# Patient Record
Sex: Female | Born: 1995 | Race: White | Hispanic: No | Marital: Single | Smoking: Never smoker
Health system: Southern US, Community
[De-identification: ages and names within clinical notes are randomized; demographics above are authoritative.]

## PROBLEM LIST (undated history)

## (undated) HISTORY — PX: KNEE SURGERY: SHX244

---
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
## 2014-08-12 ENCOUNTER — Encounter: Payer: Self-pay | Admitting: Emergency Medicine

## 2014-08-12 ENCOUNTER — Emergency Department (INDEPENDENT_AMBULATORY_CARE_PROVIDER_SITE_OTHER)
Admission: EM | Admit: 2014-08-12 | Discharge: 2014-08-12 | Disposition: A | Discharge status: Home / Self Care | Payer: PRIVATE HEALTH INSURANCE | Source: Home / Self Care | Attending: Family Medicine | Admitting: Family Medicine

## 2014-08-12 DIAGNOSIS — N390 Urinary tract infection, site not specified: Secondary | ICD-10-CM

## 2014-08-12 LAB — POCT URINALYSIS DIP (MANUAL ENTRY)
Bilirubin, UA: NEGATIVE
GLUCOSE UA: NEGATIVE
NITRITE UA: POSITIVE
RBC UA: NEGATIVE
SPEC GRAV UA: 1.015 (ref 1.005–1.03)
Urobilinogen, UA: 0.2 (ref 0–1)
pH, UA: 7.5 (ref 5–8)

## 2014-08-12 MED ORDER — SULFAMETHOXAZOLE-TRIMETHOPRIM 800-160 MG PO TABS: 1.0000 | ORAL_TABLET | Freq: Two times a day (BID) | ORAL | Status: DC

## 2014-08-12 MED ORDER — HYDROCODONE-ACETAMINOPHEN 5-325 MG PO TABS: ORAL_TABLET | ORAL | Status: DC

## 2014-08-12 NOTE — Discharge Instructions (Signed)
Continue increased fluid intake.  May use non-prescription AZO for about two days, if desired, to decrease urinary discomfort.  Try Ibuprofen 200mg , 4 tabs every 8 hours with food for back pain.  If pain not improved by ibuprofen, may begin Lortab. If symptoms become significantly worse during the night or over the weekend, proceed to the local emergency room.    Urinary Tract Infection Urinary tract infections (UTIs) can develop anywhere along your urinary tract. Your urinary tract is your body's drainage system for removing wastes and extra water. Your urinary tract includes two kidneys, two ureters, a bladder, and a urethra. Your kidneys are a pair of bean-shaped organs. Each kidney is about the size of your fist. They are located below your ribs, one on each side of your spine. CAUSES Infections are caused by microbes, which are microscopic organisms, including fungi, viruses, and bacteria. These organisms are so small that they can only be seen through a microscope. Bacteria are the microbes that most commonly cause UTIs. SYMPTOMS  Symptoms of UTIs may vary by age and gender of the patient and by the location of the infection. Symptoms in young women typically include a frequent and intense urge to urinate and a painful, burning feeling in the bladder or urethra during urination. Older women and men are more likely to be tired, shaky, and weak and have muscle aches and abdominal pain. A fever may mean the infection is in your kidneys. Other symptoms of a kidney infection include pain in your back or sides below the ribs, nausea, and vomiting. DIAGNOSIS To diagnose a UTI, your caregiver will ask you about your symptoms. Your caregiver also will ask to provide a urine sample. The urine sample will be tested for bacteria and white blood cells. White blood cells are made by your body to help fight infection. TREATMENT  Typically, UTIs can be treated with medication. Because most UTIs are caused by a  bacterial infection, they usually can be treated with the use of antibiotics. The choice of antibiotic and length of treatment depend on your symptoms and the type of bacteria causing your infection. HOME CARE INSTRUCTIONS  If you were prescribed antibiotics, take them exactly as your caregiver instructs you. Finish the medication even if you feel better after you have only taken some of the medication.  Drink enough water and fluids to keep your urine clear or pale yellow.  Avoid caffeine, tea, and carbonated beverages. They tend to irritate your bladder.  Empty your bladder often. Avoid holding urine for long periods of time.  Empty your bladder before and after sexual intercourse.  After a bowel movement, women should cleanse from front to back. Use each tissue only once. SEEK MEDICAL CARE IF:   You have back pain.  You develop a fever.  Your symptoms do not begin to resolve within 3 days. SEEK IMMEDIATE MEDICAL CARE IF:   You have severe back pain or lower abdominal pain.  You develop chills.  You have nausea or vomiting.  You have continued burning or discomfort with urination. MAKE SURE YOU:   Understand these instructions.  Will watch your condition.  Will get help right away if you are not doing well or get worse. Document Released: 04/09/2005 Document Revised: 12/30/2011 Document Reviewed: 08/08/[address] [facility] Patient Information 2015 [employer], [place]. This information is not intended to replace advice given to you by your health care provider. Make sure you discuss any questions you have with your health care provider.

## 2014-08-12 NOTE — ED Notes (Signed)
Pt c/o urinary frequency and burning. C/o severe back pain x1 day.

## 2014-08-12 NOTE — ED Provider Notes (Signed)
CSN: [id number]     Arrival date & time 08/12/14  1400 History   First MD Initiated Contact with Patient 08/12/14 1417     Chief Complaint  Patient presents with  . Dysuria      HPI Comments: Patient noticed lower back pain extending to right flank yesterday, associated with onset of dysuria and frequency.  She has had nausea without vomiting.  No abdominal pain.  No fevers, chills, and sweats.  No recent antibiotic use.  Patient's last menstrual period was 07/14/2014 (approximate).   Patient is a 19 y.o. female presenting with dysuria. The history is provided by the patient and a parent.  Dysuria Pain quality:  Burning Pain severity:  Mild Onset quality:  Sudden Duration:  1 day Timing:  Constant Progression:  Unchanged Chronicity:  New Recent urinary tract infections: yes   Relieved by:  Nothing Worsened by:  Nothing tried Urinary symptoms: frequent urination and hesitancy   Urinary symptoms: no discolored urine, no foul-smelling urine, no hematuria and no bladder incontinence   Associated symptoms: flank pain and nausea   Associated symptoms: no abdominal pain, no fever, no vaginal discharge and no vomiting   Risk factors: recurrent urinary tract infections     History reviewed. No pertinent past medical history. Past Surgical History  Procedure Laterality Date  . Knee surgery     History reviewed. No pertinent family history. History  Substance Use Topics  . Smoking status: Never Smoker   . Smokeless tobacco: Not on file  . Alcohol Use: No   OB History    No data available     Review of Systems  Constitutional: Negative for fever.  Gastrointestinal: Positive for nausea. Negative for vomiting and abdominal pain.  Genitourinary: Positive for dysuria and flank pain. Negative for vaginal discharge.    Allergies  Review of patient's allergies indicates no known allergies.  Home Medications   Prior to Admission medications   Medication Sig Start Date End Date  Taking? Authorizing Provider  HYDROcodone-acetaminophen (NORCO/VICODIN) 5-325 MG per tablet Take one by mouth at bedtime as needed for pain 08/12/14   Lattie HawStephen A Beese, MD  ibuprofen (ADVIL,MOTRIN) 200 MG tablet Take 400 mg by mouth every 6 (six) hours as needed. Pain      Historical Provider, MD  sulfamethoxazole-trimethoprim (BACTRIM DS,SEPTRA DS) 800-160 MG per tablet Take 1 tablet by mouth 2 (two) times daily. 08/12/14   Lattie HawStephen A Beese, MD   BP 107/66 mmHg  Pulse 99  Temp(Src) 98.5 F (36.9 C) (Oral)  Wt 180 lb (81.647 kg)  SpO2 100%  LMP 07/14/2014 (Approximate) Physical Exam Nursing notes and Vital Signs reviewed. Appearance:  Patient appears healthy, stated age, and in no acute distress Eyes:  Pupils are equal, round, and reactive to light and accomodation.  Extraocular movement is intact.  Conjunctivae are not inflamed  Ears:  Canals normal.  Tympanic membranes normal.  Nose:  Normal Pharynx:  Normal; moist mucous membranes  Neck:  Supple.  No adenopathy Lungs:  Clear to auscultation.  Breath sounds are equal.  Heart:  Regular rate and rhythm without murmurs, rubs, or gallops.  Abdomen:  Mild tenderness over bladder without masses or hepatosplenomegaly.  Bowel sounds are present.  Mild right flank tenderness present without CVA tenderness. Extremities:  No edema.  No calf tenderness Skin:  No rash present.   ED Course  Procedures  None    Labs Reviewed  URINE CULTURE  POCT URINALYSIS DIP (MANUAL ENTRY):  KET 15mg /dL; PRO  Trace; NIT positive; LEU small       MDM   1. Urinary tract infection without hematuria, site unspecified    Urine culture pending.   Begin Bactrim DS BID for five days.  Lortab for pain at night. Continue increased fluid intake.  May use non-prescription AZO for about two days, if desired, to decrease urinary discomfort.  Try Ibuprofen , 4 tabs every 8 hours with food for back pain.  If pain not improved by ibuprofen, may begin Lortab. If  symptoms become significantly worse during the night or over the weekend, proceed to the local emergency room.  Followup with Family Doctor if not improved in 5 days.    Lattie Haw, MD 08/13/14 [phone]

## 2014-08-14 LAB — URINE CULTURE: Colony Count: >=100000

## 2017-01-15 ENCOUNTER — Other Ambulatory Visit: Payer: Self-pay | Admitting: Nurse Practitioner

## 2017-01-15 ENCOUNTER — Other Ambulatory Visit (HOSPITAL_COMMUNITY)
Admission: RE | Admit: 2017-01-15 | Discharge: 2017-01-15 | Disposition: A | Discharge status: Home / Self Care | Payer: PRIVATE HEALTH INSURANCE | Source: Ambulatory Visit | Attending: Nurse Practitioner | Admitting: Nurse Practitioner

## 2017-01-15 DIAGNOSIS — Z124 Encounter for screening for malignant neoplasm of cervix: Secondary | ICD-10-CM | POA: Diagnosis not present

## 2017-01-19 LAB — CYTOLOGY - PAP
Chlamydia: NEGATIVE
Diagnosis: NEGATIVE
Neisseria Gonorrhea: NEGATIVE

## 2017-04-01 ENCOUNTER — Emergency Department
Admission: EM | Admit: 2017-04-01 | Discharge: 2017-04-01 | Disposition: A | Discharge status: Home / Self Care | Payer: PRIVATE HEALTH INSURANCE | Source: Home / Self Care | Attending: Family Medicine | Admitting: Family Medicine

## 2017-04-01 ENCOUNTER — Encounter: Payer: Self-pay | Admitting: Emergency Medicine

## 2017-04-01 DIAGNOSIS — N3090 Cystitis, unspecified without hematuria: Secondary | ICD-10-CM | POA: Diagnosis not present

## 2017-04-01 LAB — POCT URINALYSIS DIP (MANUAL ENTRY)
BILIRUBIN UA: NEGATIVE
BILIRUBIN UA: NEGATIVE mg/dL
Glucose, UA: NEGATIVE mg/dL
Nitrite, UA: NEGATIVE
PH UA: 7.5 (ref 5.0–8.0)
Spec Grav, UA: 1.015 (ref 1.010–1.025)
Urobilinogen, UA: 0.2 E.U./dL — AB

## 2017-04-01 MED ORDER — ONDANSETRON 4 MG PO TBDP: ORAL_TABLET | ORAL | 0 refills | Status: DC

## 2017-04-01 MED ORDER — NITROFURANTOIN MONOHYD MACRO 100 MG PO CAPS: 100.0000 mg | ORAL_CAPSULE | Freq: Two times a day (BID) | ORAL | 0 refills | Status: DC

## 2017-04-01 NOTE — Discharge Instructions (Signed)
Increase fluid intake. May use non-prescription AZO for about two days, if desired, to decrease urinary discomfort.  If symptoms become significantly worse during the night or over the weekend, proceed to the local emergency room.  

## 2017-04-01 NOTE — ED Triage Notes (Signed)
Pt c/o back pain, dysuria, nausea and low grade temp since yesterday.

## 2017-04-01 NOTE — ED Provider Notes (Signed)
[facility]    CSN: [id number] Arrival date & time: 04/01/17  1407     History   Chief Complaint Chief Complaint  Patient presents with  . Dysuria    HPI Briana Murray is a 20 y.o. female.   Yesterday patient developed nausea (without vomiting), mild right flank pain, dysuria, and low grade temp.  No abdominal or pelvic pain.   The history is provided by the patient.  Dysuria  Pain quality:  Burning Pain severity:  Mild Onset quality:  Sudden Duration:  1 day Timing:  Constant Progression:  Worsening Chronicity:  New Recent urinary tract infections: no   Relieved by:  Nothing Worsened by:  Nothing Ineffective treatments:  Cranberry juice Urinary symptoms: frequent urination and hesitancy   Urinary symptoms: no discolored urine, no foul-smelling urine, no hematuria and no bladder incontinence   Associated symptoms: flank pain and nausea   Associated symptoms: no abdominal pain, no fever, no vaginal discharge and no vomiting     History reviewed. No pertinent past medical history.  There are no active problems to display for this patient.   Past Surgical History:  Procedure Laterality Date  . KNEE SURGERY      OB History    No data available       Home Medications    Prior to Admission medications   Medication Sig Start Date End Date Taking? Authorizing Provider  HYDROcodone-acetaminophen (NORCO/VICODIN) 5-325 MG per tablet Take one by mouth at bedtime as needed for pain 08/12/14   Lattie Haw, MD  ibuprofen (ADVIL,MOTRIN) 200 MG tablet Take 400 mg by mouth every 6 (six) hours as needed. Pain      [provider]  nitrofurantoin, macrocrystal-monohydrate, (MACROBID) 100 MG capsule Take 1 capsule (100 mg total) by mouth 2 (two) times daily. Take with food. 04/01/17   Lattie Haw, MD  sulfamethoxazole-trimethoprim (BACTRIM DS,SEPTRA DS) 800-160 MG per tablet Take 1 tablet by mouth 2 (two) times daily. 08/12/14   Lattie Haw, MD    Family History History reviewed. No pertinent family history.  Social History Social History  Substance Use Topics  . Smoking status: Never Smoker  . Smokeless tobacco: Never Used  . Alcohol use No     Allergies   Patient has no known allergies.   Review of Systems Review of Systems  Constitutional: Negative for fever.  Gastrointestinal: Positive for nausea. Negative for abdominal pain and vomiting.  Genitourinary: Positive for dysuria and flank pain. Negative for vaginal discharge.  All other systems reviewed and are negative.    Physical Exam Triage Vital Signs ED Triage Vitals  Enc Vitals Group     BP 04/01/17 1448 108/67     Pulse Rate 04/01/17 1448 79     Resp --      Temp 04/01/17 1448 98.4 F (36.9 C)     Temp Source 04/01/17 1448 Oral     SpO2 04/01/17 1448 99 %     Weight 04/01/17 1449 168 lb (76.2 kg)     Height --      Head Circumference --      Peak Flow --      Pain Score 04/01/17 1449 6     Pain Loc --      Pain Edu? --      Excl. in GC? --    No data found.   Updated Vital Signs BP 108/67 (BP Location: Right Arm)   Pulse 79  Temp 98.4 F (36.9 C) (Oral)   Wt 168 lb (76.2 kg)   SpO2 99%   Visual Acuity Right Eye Distance:   Left Eye Distance:   Bilateral Distance:    Right Eye Near:   Left Eye Near:    Bilateral Near:     Physical Exam Nursing notes and Vital Signs reviewed. Appearance:  Patient appears stated age, and in no acute distress.    Eyes:  Pupils are equal, round, and reactive to light and accomodation.  Extraocular movement is intact.  Conjunctivae are not inflamed   Pharynx:  Normal; moist mucous membranes  Neck:  Supple.  No adenopathy Lungs:  Clear to auscultation.  Breath sounds are equal.  Moving air well. Heart:  Regular rate and rhythm without murmurs, rubs, or gallops.  Abdomen:  Nontender without masses or hepatosplenomegaly.  Bowel sounds are present.  No CVA or flank tenderness.  Extremities:   No edema.  Skin:  No rash present.     UC Treatments / Results  Labs (all labs ordered are listed, but only abnormal results are displayed) Labs Reviewed  POCT URINALYSIS DIP (MANUAL ENTRY) - Abnormal; Notable for the following:       Result Value   Blood, UA small (*)    Protein Ur, POC trace (*)    Urobilinogen, UA negative (*)    Leukocytes, UA Small (1+) (*)    All other components within normal limits  URINE CULTURE    EKG  EKG Interpretation None       Radiology No results found.  Procedures Procedures (including critical care time)  Medications Ordered in UC Medications - No data to display   Initial Impression / Assessment and Plan / UC Course  I have reviewed the triage vital signs and the nursing notes.  Pertinent labs & imaging results that were available during my care of the patient were reviewed by me and considered in my medical decision making (see chart for details).    Urine culture pending. Begin Macrobid  BID for one week. Increase fluid intake. May use non-prescription AZO for about two days, if desired, to decrease urinary discomfort.  If symptoms become significantly worse during the night or over the weekend, proceed to the local emergency room.  Followup with Family Doctor if not improved in one week.     Final Clinical Impressions(s) / UC Diagnoses   Final diagnoses:  Cystitis    New Prescriptions New Prescriptions   NITROFURANTOIN, MACROCRYSTAL-MONOHYDRATE, (MACROBID) 100 MG CAPSULE    Take 1 capsule (100 mg total) by mouth 2 (two) times daily. Take with food.         Lattie Haw, MD 04/05/17 1034

## 2017-04-04 ENCOUNTER — Telehealth: Payer: Self-pay | Admitting: Emergency Medicine

## 2017-04-04 LAB — URINE CULTURE
MICRO NUMBER: 81036227
SPECIMEN QUALITY:: ADEQUATE

## 2017-04-05 ENCOUNTER — Telehealth: Payer: Self-pay | Admitting: Emergency Medicine

## 2017-04-05 MED ORDER — CEPHALEXIN 500 MG PO CAPS: 500.0000 mg | ORAL_CAPSULE | Freq: Two times a day (BID) | ORAL | 0 refills | Status: DC

## 2017-04-05 NOTE — Telephone Encounter (Signed)
Patient informed of positive urine culture.  Per Waylan Rocher, antbs given will not treat the infection.  Patient will be switched to Keflex  bid #14 no refills.  Please send antbs to [facility] [address].

## 2017-04-05 NOTE — Addendum Note (Signed)
Addended by: Lurene Shadow on: 04/05/2017 03:32 PM   Modules accepted: Orders

## 2017-04-05 NOTE — Telephone Encounter (Signed)
Antibiotic sent to pharmacy.  

## 2017-04-15 ENCOUNTER — Telehealth: Payer: Self-pay | Admitting: *Deleted

## 2017-04-15 MED ORDER — FLUCONAZOLE 150 MG PO TABS: 150.0000 mg | ORAL_TABLET | ORAL | 0 refills | Status: AC

## 2017-04-15 NOTE — Telephone Encounter (Signed)
Pt called reports that she now has vaginal itching and white d/c after taking Macrobid for UTI. Per standing order sent rx [facility].

## 2018-05-18 ENCOUNTER — Other Ambulatory Visit: Payer: Self-pay

## 2018-05-18 ENCOUNTER — Emergency Department (INDEPENDENT_AMBULATORY_CARE_PROVIDER_SITE_OTHER)
Admission: EM | Admit: 2018-05-18 | Discharge: 2018-05-18 | Disposition: A | Discharge status: Home / Self Care | Payer: PRIVATE HEALTH INSURANCE | Source: Home / Self Care | Attending: Family Medicine | Admitting: Family Medicine

## 2018-05-18 ENCOUNTER — Emergency Department (INDEPENDENT_AMBULATORY_CARE_PROVIDER_SITE_OTHER): Payer: PRIVATE HEALTH INSURANCE

## 2018-05-18 DIAGNOSIS — R05 Cough: Secondary | ICD-10-CM

## 2018-05-18 DIAGNOSIS — R509 Fever, unspecified: Secondary | ICD-10-CM

## 2018-05-18 DIAGNOSIS — R109 Unspecified abdominal pain: Secondary | ICD-10-CM

## 2018-05-18 DIAGNOSIS — J209 Acute bronchitis, unspecified: Secondary | ICD-10-CM | POA: Diagnosis not present

## 2018-05-18 DIAGNOSIS — R079 Chest pain, unspecified: Secondary | ICD-10-CM | POA: Diagnosis not present

## 2018-05-18 LAB — POCT URINALYSIS DIP (MANUAL ENTRY)
BILIRUBIN UA: NEGATIVE
BILIRUBIN UA: NEGATIVE mg/dL
Glucose, UA: NEGATIVE mg/dL
LEUKOCYTES UA: NEGATIVE
Nitrite, UA: NEGATIVE
Protein Ur, POC: NEGATIVE mg/dL
Spec Grav, UA: 1.025 (ref 1.010–1.025)
Urobilinogen, UA: 0.2 E.U./dL
pH, UA: 7 (ref 5.0–8.0)

## 2018-05-18 MED ORDER — BENZONATATE 200 MG PO CAPS: ORAL_CAPSULE | ORAL | 0 refills | Status: DC

## 2018-05-18 MED ORDER — LEVOFLOXACIN 500 MG PO TABS: ORAL_TABLET | ORAL | 0 refills | Status: DC

## 2018-05-18 NOTE — Discharge Instructions (Addendum)
Take plain guaifenesin (1200mg  extended release tabs such as Mucinex) twice daily, with plenty of water, for cough and congestion.  May add Pseudoephedrine (30mg , one or two every 4 to 6 hours) for sinus congestion.  Get adequate rest.   Try warm salt water gargles for sore throat.  Stop all antihistamines for now, and other non-prescription cough/cold preparations. May take Ibuprofen 200mg , 4 tabs every 8 hours with food for chest discomfort. May take Delsym Cough Suppressant with Tessalon at bedtime for nighttime cough.

## 2018-05-18 NOTE — ED Provider Notes (Signed)
[facility]    CSN: [id number] Arrival date & time: 05/18/18  1210     History   Chief Complaint Chief Complaint  Patient presents with  . Back Pain  . Nasal Congestion    HPI Briana Murray is a 22 y.o. female.   Patient is concerned that she could have a UTI.  She has a past history of kidney stone and pyelonephritis.  However, she has minimal urine symptoms.  She is presently having her period. She complains of onset of a cough about two weeks ago, with mild sore throat and nasal congestion.  She has had increased congestion for a week, developing fever/chills.  About 3 days ago she developed pain in her back with cough.  During the past 2 to 3 hours her back pain has become more intense in her right posterior chest.  The history is provided by the patient and a parent.    History reviewed. No pertinent past medical history.  There are no active problems to display for this patient.   Past Surgical History:  Procedure Laterality Date  . KNEE SURGERY      OB History   None      Home Medications    Prior to Admission medications   Medication Sig Start Date End Date Taking? Authorizing Provider  methylphenidate 36 MG PO CR tablet Take 36 mg by mouth daily.   Yes [provider]  benzonatate (TESSALON) 200 MG capsule Take one cap by mouth at bedtime as needed for cough.  May repeat in 4 to 6 hours 05/18/18   Lattie Haw, MD  etonogestrel (NEXPLANON) 68 MG IMPL implant by Subdermal route.    [provider]  ibuprofen (ADVIL,MOTRIN) 200 MG tablet Take 400 mg by mouth every 6 (six) hours as needed. Pain      [provider]  levofloxacin (LEVAQUIN) 500 MG tablet Take one tab by mouth once daily for one week 05/18/18   Lattie Haw, MD    Family History History reviewed. No pertinent family history.  Social History Social History   Tobacco Use  . Smoking status: Never Smoker  . Smokeless tobacco: Never Used    Substance Use Topics  . Alcohol use: No  . Drug use: Not on file     Allergies   Patient has no known allergies.   Review of Systems Review of Systems + sore throat + cough ? pleuritic pain No wheezing + nasal congestion + post-nasal drainage No sinus pain/pressure No itchy/red eyes No earache No hemoptysis No SOB + fever, + chills No nausea No vomiting No abdominal pain No diarrhea No urinary symptoms No skin rash + fatigue No myalgias No headache    Physical Exam Triage Vital Signs ED Triage Vitals  Enc Vitals Group     BP 05/18/18 1247 135/77     Pulse Rate 05/18/18 1247 97     Resp 05/18/18 1247 20     Temp 05/18/18 1247 98.9 F (37.2 C)     Temp Source 05/18/18 1247 Oral     SpO2 05/18/18 1247 100 %     Weight 05/18/18 1249 191 lb (86.6 kg)     Height 05/18/18 1249 5\' 9"  (1.753 m)     Head Circumference --      Peak Flow --      Pain Score 05/18/18 1248 6     Pain Loc --      Pain Edu? --  Excl. in GC? --    No data found.  Updated Vital Signs BP 135/77 (BP Location: Right Arm)   Pulse 97   Temp 98.9 F (37.2 C) (Oral)   Resp 20   Ht 5\' 9"  (1.753 m)   Wt 86.6 kg   LMP 05/18/2018   SpO2 100%   BMI 28.21 kg/m   Visual Acuity Right Eye Distance:   Left Eye Distance:   Bilateral Distance:    Right Eye Near:   Left Eye Near:    Bilateral Near:     Physical Exam Nursing notes and Vital Signs reviewed. Appearance:  Patient appears stated age, and in no acute distress Eyes:  Pupils are equal, round, and reactive to light and accomodation.  Extraocular movement is intact.  Conjunctivae are not inflamed  Ears:  Canals normal.  Tympanic membranes normal.  Nose:  Mildly congested turbinates.  No sinus tenderness.   Pharynx:  Normal Neck:  Supple.  Enlarged posterior/lateral nodes are palpated bilaterally, tender to palpation on the left.   Lungs:  Bibasilar rhonchi.  Breath sounds are equal.  Moving air well. Heart:  Regular rate  and rhythm without murmurs, rubs, or gallops.  Abdomen:  Nontender without masses or hepatosplenomegaly.  Bowel sounds are present.  No CVA or flank tenderness.  Extremities:  No edema.  Skin:  No rash present.    UC Treatments / Results  Labs (all labs ordered are listed, but only abnormal results are displayed) Labs Reviewed  POCT URINALYSIS DIP (MANUAL ENTRY) - Abnormal; Notable for the following components:      Result Value   Blood, UA trace-lysed (*)    All other components within normal limits  URINE CULTURE    EKG None  Radiology Dg Chest 2 View  Result Date: 05/18/2018 CLINICAL DATA:  Cough for 2 weeks, left posterior chest pain today, fever, malaise EXAM: CHEST - 2 VIEW COMPARISON:  None. FINDINGS: No active infiltrate or effusion is seen. Mediastinal and hilar contours are unremarkable. The heart is within normal limits in size. No acute bony abnormality is seen. IMPRESSION: No active cardiopulmonary disease. Electronically Signed   By: Dwyane Dee M.D.   On: 05/18/2018 13:14    Procedures Procedures (including critical care time)  Medications Ordered in UC Medications - No data to display  Initial Impression / Assessment and Plan / UC Course  I have reviewed the triage vital signs and the nursing notes.  Pertinent labs & imaging results that were available during my care of the patient were reviewed by me and considered in my medical decision making (see chart for details).    Suspect developing bacterial bronchitis from a persistent viral URI.  Urinalysis unremarkable but will send urine culture. Begin empiric Levaquin 500mg  daily for one week. Prescription written for Benzonatate [facility]) to take at bedtime for night-time cough.  Followup with Family Doctor if not improved in one week.    Final Clinical Impressions(s) / UC Diagnoses   Final diagnoses:  Left flank pain  Acute bronchitis, unspecified organism     Discharge Instructions     Take plain  guaifenesin (1200mg  extended release tabs such as Mucinex) twice daily, with plenty of water, for cough and congestion.  May add Pseudoephedrine (30mg , one or two every 4 to 6 hours) for sinus congestion.  Get adequate rest.   Try warm salt water gargles for sore throat.  Stop all antihistamines for now, and other non-prescription cough/cold preparations. May take Ibuprofen 200mg ,  4 tabs every 8 hours with food for chest discomfort. May take Delsym Cough Suppressant with Tessalon at bedtime for nighttime cough.     ED Prescriptions    Medication Sig Dispense Auth. Provider   levofloxacin (LEVAQUIN) 500 MG tablet Take one tab by mouth once daily for one week 7 tablet Lattie Haw, MD   benzonatate (TESSALON) 200 MG capsule Take one cap by mouth at bedtime as needed for cough.  May repeat in 4 to 6 hours 15 capsule Cathren Harsh Tera Mater, MD         Lattie Haw, MD 05/18/18 [phone]

## 2018-05-18 NOTE — ED Triage Notes (Signed)
Pt c/o back pain 6/10 starting 3 days ago and cold like symptoms 2-3 weeks. Back pain has intensified in the last 2-3 hrs. Of note pt is currently menstruating.

## 2018-05-19 LAB — URINE CULTURE
MICRO NUMBER:: 91330480
SPECIMEN QUALITY:: ADEQUATE

## 2018-05-20 ENCOUNTER — Telehealth: Payer: Self-pay

## 2018-05-20 NOTE — Telephone Encounter (Signed)
Left voice message inquiring about patients status. Encouraged patient to call with questions or concerns. Given neg ucx results as well.

## 2018-09-20 ENCOUNTER — Ambulatory Visit: Payer: PRIVATE HEALTH INSURANCE | Admitting: Sports Medicine

## 2018-09-20 ENCOUNTER — Ambulatory Visit (INDEPENDENT_AMBULATORY_CARE_PROVIDER_SITE_OTHER): Payer: PRIVATE HEALTH INSURANCE

## 2018-09-20 ENCOUNTER — Encounter: Payer: Self-pay | Admitting: Sports Medicine

## 2018-09-20 DIAGNOSIS — S62515A Nondisplaced fracture of proximal phalanx of left thumb, initial encounter for closed fracture: Secondary | ICD-10-CM | POA: Diagnosis not present

## 2018-09-20 DIAGNOSIS — S5332XA Traumatic rupture of left ulnar collateral ligament, initial encounter: Secondary | ICD-10-CM

## 2018-09-20 DIAGNOSIS — S63642A Sprain of metacarpophalangeal joint of left thumb, initial encounter: Secondary | ICD-10-CM | POA: Insufficient documentation

## 2018-09-20 DIAGNOSIS — W230XXA Caught, crushed, jammed, or pinched between moving objects, initial encounter: Secondary | ICD-10-CM | POA: Diagnosis not present

## 2018-09-20 NOTE — Progress Notes (Addendum)
Subjective:    CC: Left hand injury  HPI:  This is a pleasant 23 year old female, she went on a cruise a week ago, inadvertently injured her left thumb, she was running to the slide and it got caught and bent back.  She had pain, swelling, bruising.  Symptoms are improving considerably.  Localized at the ulnar aspect of the MCP.  No radiation.  I reviewed the past medical history, family history, social history, surgical history, and allergies today and no changes were needed.  Please see the problem list section below in epic for further details.  Past Medical History: No past medical history on file. Past Surgical History: Past Surgical History:  Procedure Laterality Date  . KNEE SURGERY     Social History: Social History   Socioeconomic History  . Marital status: Single    Spouse name: Not on file  . Number of children: Not on file  . Years of education: Not on file  . Highest education level: Not on file  Occupational History  . Not on file  Social Needs  . Financial resource strain: Not on file  . Food insecurity:    Worry: Not on file    Inability: Not on file  . Transportation needs:    Medical: Not on file    Non-medical: Not on file  Tobacco Use  . Smoking status: Never Smoker  . Smokeless tobacco: Never Used  Substance and Sexual Activity  . Alcohol use: No  . Drug use: Not on file  . Sexual activity: Yes    Birth control/protection: None  Lifestyle  . Physical activity:    Days per week: Not on file    Minutes per session: Not on file  . Stress: Not on file  Relationships  . Social connections:    Talks on phone: Not on file    Gets together: Not on file    Attends religious service: Not on file    Active member of club or organization: Not on file    Attends meetings of clubs or organizations: Not on file    Relationship status: Not on file  Other Topics Concern  . Not on file  Social History Narrative  . Not on file   Family History: No  family history on file. Allergies: No Known Allergies Medications: See med rec.  Review of Systems: No headache, visual changes, nausea, vomiting, diarrhea, constipation, dizziness, abdominal pain, skin rash, fevers, chills, night sweats, swollen lymph nodes, weight loss, chest pain, body aches, joint swelling, muscle aches, shortness of breath, mood changes, visual or auditory hallucinations.  Objective:    General: Well Developed, well nourished, and in no acute distress.  Neuro: Alert and oriented x3, extra-ocular muscles intact, sensation grossly intact.  HEENT: Normocephalic, atraumatic, pupils equal round reactive to light, neck supple, no masses, no lymphadenopathy, thyroid nonpalpable.  Skin: Warm and dry, no rashes noted.  Cardiac: Regular rate and rhythm, no murmurs rubs or gallops.  Respiratory: Clear to auscultation bilaterally. Not using accessory muscles, speaking in full sentences.  Abdominal: Soft, nontender, nondistended, positive bowel sounds, no masses, no organomegaly.  Left hand: Tender to palpation at the first MCP at the ulnar aspect.  Mild joint laxity.  Neurovascular intact distally.  Thumb spica brace applied.  Impression and Recommendations:    The patient was counselled, risk factors were discussed, anticipatory guidance given.  Left first proximal phalangeal fracture with gamekeeper's thumb. Injury about a week ago on a cruise ship. Thumb spica, may  use Tylenol and ibuprofen over-the-counter, x-rays. Return in 2 weeks.  X-rays do show a fracture of the proximal phalanx, nondisplaced, nonangulated, no change in treatment plan.   ___________________________________________ Ihor Austin. Benjamin Stain, M.D., ABFM., CAQSM. Primary Care and Sports Medicine [facility]  Adjunct Professor of Family Medicine  [employer]

## 2018-09-20 NOTE — Assessment & Plan Note (Addendum)
Injury about a week ago on a cruise ship. Thumb spica, may use Tylenol and ibuprofen over-the-counter, x-rays. Return in 2 weeks.  X-rays do show a fracture of the proximal phalanx, nondisplaced, nonangulated, no change in treatment plan.

## 2018-10-04 ENCOUNTER — Ambulatory Visit: Payer: PRIVATE HEALTH INSURANCE | Admitting: Sports Medicine

## 2018-10-04 ENCOUNTER — Ambulatory Visit (INDEPENDENT_AMBULATORY_CARE_PROVIDER_SITE_OTHER): Payer: PRIVATE HEALTH INSURANCE

## 2018-10-04 ENCOUNTER — Other Ambulatory Visit: Payer: Self-pay

## 2018-10-04 ENCOUNTER — Encounter: Payer: Self-pay | Admitting: Sports Medicine

## 2018-10-04 DIAGNOSIS — S5332XD Traumatic rupture of left ulnar collateral ligament, subsequent encounter: Secondary | ICD-10-CM | POA: Diagnosis not present

## 2018-10-04 DIAGNOSIS — X501XXD Overexertion from prolonged static or awkward postures, subsequent encounter: Secondary | ICD-10-CM

## 2018-10-04 DIAGNOSIS — S63642D Sprain of metacarpophalangeal joint of left thumb, subsequent encounter: Secondary | ICD-10-CM

## 2018-10-04 DIAGNOSIS — S62515D Nondisplaced fracture of proximal phalanx of left thumb, subsequent encounter for fracture with routine healing: Secondary | ICD-10-CM

## 2018-10-04 MED ORDER — TRAMADOL HCL 50 MG PO TABS: 50.0000 mg | ORAL_TABLET | Freq: Three times a day (TID) | ORAL | 0 refills | Status: DC | PRN

## 2018-10-04 NOTE — Assessment & Plan Note (Addendum)
2 weeks post injury, continue thumb spica. Repeat x-rays today, she did jam it over the past week. Adding some tramadol for pain, I also pulled her brace distally a bit to better immobilize the thumb. Return to see me in 1 month.

## 2018-10-04 NOTE — Progress Notes (Signed)
  Subjective:    CC: Recheck fracture  HPI: Briana Murray returns, she is a pleasant 23 year old female, she has a left first proximal phalangeal fracture with a gamekeeper's thumb, she sustained this on a cruise about 3 weeks ago.  She still has a bit of pain in her spica brace.  I reviewed the past medical history, family history, social history, surgical history, and allergies today and no changes were needed.  Please see the problem list section below in epic for further details.  Past Medical History: No past medical history on file. Past Surgical History: Past Surgical History:  Procedure Laterality Date  . KNEE SURGERY     Social History: Social History   Socioeconomic History  . Marital status: Single    Spouse name: Not on file  . Number of children: Not on file  . Years of education: Not on file  . Highest education level: Not on file  Occupational History  . Not on file  Social Needs  . Financial resource strain: Not on file  . Food insecurity:    Worry: Not on file    Inability: Not on file  . Transportation needs:    Medical: Not on file    Non-medical: Not on file  Tobacco Use  . Smoking status: Never Smoker  . Smokeless tobacco: Never Used  Substance and Sexual Activity  . Alcohol use: No  . Drug use: Not on file  . Sexual activity: Yes    Birth control/protection: None  Lifestyle  . Physical activity:    Days per week: Not on file    Minutes per session: Not on file  . Stress: Not on file  Relationships  . Social connections:    Talks on phone: Not on file    Gets together: Not on file    Attends religious service: Not on file    Active member of club or organization: Not on file    Attends meetings of clubs or organizations: Not on file    Relationship status: Not on file  Other Topics Concern  . Not on file  Social History Narrative  . Not on file   Family History: No family history on file. Allergies: No Known Allergies Medications: See med  rec.  Review of Systems: No fevers, chills, night sweats, weight loss, chest pain, or shortness of breath.   Objective:    General: Well Developed, well nourished, and in no acute distress.  Neuro: Alert and oriented x3, extra-ocular muscles intact, sensation grossly intact.  HEENT: Normocephalic, atraumatic, pupils equal round reactive to light, neck supple, no masses, no lymphadenopathy, thyroid nonpalpable.  Skin: Warm and dry, no rashes. Cardiac: Regular rate and rhythm, no murmurs rubs or gallops, no lower extremity edema.  Respiratory: Clear to auscultation bilaterally. Not using accessory muscles, speaking in full sentences. Left hand: Tender to palpation over the distal first metacarpal.  Impression and Recommendations:    Left first proximal phalangeal fracture with gamekeeper's thumb. 2 weeks post injury, continue thumb spica. Repeat x-rays today, she did jam it over the past week. Adding some tramadol for pain, I also pulled her brace distally a bit to better immobilize the thumb. Return to see me in 1 month.   ___________________________________________ Ihor Austin. Benjamin Stain, M.D., ABFM., CAQSM. Primary Care and Sports Medicine [facility]  Adjunct Professor of Family Medicine  [employer]

## 2018-11-01 ENCOUNTER — Ambulatory Visit: Payer: PRIVATE HEALTH INSURANCE | Admitting: Sports Medicine

## 2018-11-01 ENCOUNTER — Encounter: Payer: Self-pay | Admitting: Sports Medicine

## 2018-11-01 DIAGNOSIS — S63642D Sprain of metacarpophalangeal joint of left thumb, subsequent encounter: Secondary | ICD-10-CM

## 2018-11-01 DIAGNOSIS — S5332XD Traumatic rupture of left ulnar collateral ligament, subsequent encounter: Secondary | ICD-10-CM

## 2018-11-01 MED ORDER — TRAMADOL HCL 50 MG PO TABS: 50.0000 mg | ORAL_TABLET | Freq: Three times a day (TID) | ORAL | 0 refills | Status: DC | PRN

## 2018-11-01 NOTE — Progress Notes (Signed)
Subjective:    CC: Follow-up fracture  HPI: Briana Murray returns, she is a pleasant 23 year old female, she is approximately 6 weeks post fracture of the base of her left first proximal phalanx.  She has been in a thumb spica brace, and continues to improve.  She has a bit of discomfort at the site of the fracture but much better than before.  I reviewed the past medical history, family history, social history, surgical history, and allergies today and no changes were needed.  Please see the problem list section below in epic for further details.  Past Medical History: No past medical history on file. Past Surgical History: Past Surgical History:  Procedure Laterality Date  . KNEE SURGERY     Social History: Social History   Socioeconomic History  . Marital status: Single    Spouse name: Not on file  . Number of children: Not on file  . Years of education: Not on file  . Highest education level: Not on file  Occupational History  . Not on file  Social Needs  . Financial resource strain: Not on file  . Food insecurity:    Worry: Not on file    Inability: Not on file  . Transportation needs:    Medical: Not on file    Non-medical: Not on file  Tobacco Use  . Smoking status: Never Smoker  . Smokeless tobacco: Never Used  Substance and Sexual Activity  . Alcohol use: No  . Drug use: Not on file  . Sexual activity: Yes    Birth control/protection: None  Lifestyle  . Physical activity:    Days per week: Not on file    Minutes per session: Not on file  . Stress: Not on file  Relationships  . Social connections:    Talks on phone: Not on file    Gets together: Not on file    Attends religious service: Not on file    Active member of club or organization: Not on file    Attends meetings of clubs or organizations: Not on file    Relationship status: Not on file  Other Topics Concern  . Not on file  Social History Narrative  . Not on file   Family History: No family  history on file. Allergies: No Known Allergies Medications: See med rec.  Review of Systems: No fevers, chills, night sweats, weight loss, chest pain, or shortness of breath.   Objective:    General: Well Developed, well nourished, and in no acute distress.  Neuro: Alert and oriented x3, extra-ocular muscles intact, sensation grossly intact.  HEENT: Normocephalic, atraumatic, pupils equal round reactive to light, neck supple, no masses, no lymphadenopathy, thyroid nonpalpable.  Skin: Warm and dry, no rashes. Cardiac: Regular rate and rhythm, no murmurs rubs or gallops, no lower extremity edema.  Respiratory: Clear to auscultation bilaterally. Not using accessory muscles, speaking in full sentences. Left hand: Only minimal tenderness at the ulnar base of the first proximal phalanx.  Good strength, good range of motion, ulnar collateral ligament is stable.  Impression and Recommendations:    Left first proximal phalangeal fracture with gamekeeper's thumb. Approximately 6 weeks post fracture of the base of the first proximal phalanx. Doing better, still has a bit of tenderness over the fracture. Continue brace for another 2 weeks, the last set of x-rays did show a significant amount of healing. Refilling a bit of tramadol for now. Return to see me in 1 month.   ___________________________________________ Ihor Austin. Benjamin Stain,  M.D., ABFM., CAQSM. Primary Care and Sports Medicine [facility]  Adjunct Professor of [employer]

## 2018-11-01 NOTE — Assessment & Plan Note (Signed)
Approximately 6 weeks post fracture of the base of the first proximal phalanx. Doing better, still has a bit of tenderness over the fracture. Continue brace for another 2 weeks, the last set of x-rays did show a significant amount of healing. Refilling a bit of tramadol for now. Return to see me in 1 month.

## 2018-11-08 ENCOUNTER — Other Ambulatory Visit: Payer: Self-pay | Admitting: Sports Medicine

## 2018-11-08 DIAGNOSIS — S5332XD Traumatic rupture of left ulnar collateral ligament, subsequent encounter: Secondary | ICD-10-CM

## 2018-11-08 DIAGNOSIS — S63642D Sprain of metacarpophalangeal joint of left thumb, subsequent encounter: Secondary | ICD-10-CM

## 2018-11-29 ENCOUNTER — Ambulatory Visit: Payer: PRIVATE HEALTH INSURANCE | Admitting: Sports Medicine

## 2018-12-01 ENCOUNTER — Ambulatory Visit (INDEPENDENT_AMBULATORY_CARE_PROVIDER_SITE_OTHER): Payer: PRIVATE HEALTH INSURANCE

## 2018-12-01 ENCOUNTER — Other Ambulatory Visit: Payer: Self-pay

## 2018-12-01 ENCOUNTER — Ambulatory Visit: Payer: PRIVATE HEALTH INSURANCE | Admitting: Sports Medicine

## 2018-12-01 ENCOUNTER — Encounter: Payer: Self-pay | Admitting: Sports Medicine

## 2018-12-01 DIAGNOSIS — S5332XD Traumatic rupture of left ulnar collateral ligament, subsequent encounter: Secondary | ICD-10-CM | POA: Diagnosis not present

## 2018-12-01 DIAGNOSIS — S63642D Sprain of metacarpophalangeal joint of left thumb, subsequent encounter: Secondary | ICD-10-CM

## 2018-12-01 DIAGNOSIS — S83005S Unspecified dislocation of left patella, sequela: Secondary | ICD-10-CM | POA: Diagnosis not present

## 2018-12-01 NOTE — Assessment & Plan Note (Signed)
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 Thurston Hole in the past. Has recently had another few dislocations. She has a very mobile and easily dislocated patella on exam today. At this point I do think she probably needs a repeat MPFL reconstruction and possibly a Fulkerson slide or something of the sort. X-rays, MRI, continue meloxicam. Continue brace. Referral to Dr. Everardo Pacific to discuss this.

## 2018-12-01 NOTE — Progress Notes (Signed)
Subjective:    CC: Follow-up  HPI: Briana HesselbachMaria returns, she is a pleasant 23 year old female, her thumb is better.  Unfortunately she has re-dislocated her left patella.  She has a history of recurrent patellar dislocation post MPFL reconstruction, she has always had a bit of discomfort.  More recently when trying to run she twisted her ankle, this took her knee into valgus and she dislocated her patella.  She reduced it herself, started meloxicam, applied a brace and is here for further evaluation and definitive treatment.  Pain is moderate, persistent, localized anteriorly, medial to the patella without radiation.  I reviewed the past medical history, family history, social history, surgical history, and allergies today and no changes were needed.  Please see the problem list section below in epic for further details.  Past Medical History: No past medical history on file. Past Surgical History: Past Surgical History:  Procedure Laterality Date  . KNEE SURGERY     Social History: Social History   Socioeconomic History  . Marital status: Single    Spouse name: Not on file  . Number of children: Not on file  . Years of education: Not on file  . Highest education level: Not on file  Occupational History  . Not on file  Social Needs  . Financial resource strain: Not on file  . Food insecurity:    Worry: Not on file    Inability: Not on file  . Transportation needs:    Medical: Not on file    Non-medical: Not on file  Tobacco Use  . Smoking status: Never Smoker  . Smokeless tobacco: Never Used  Substance and Sexual Activity  . Alcohol use: No  . Drug use: Not on file  . Sexual activity: Yes    Birth control/protection: None  Lifestyle  . Physical activity:    Days per week: Not on file    Minutes per session: Not on file  . Stress: Not on file  Relationships  . Social connections:    Talks on phone: Not on file    Gets together: Not on file    Attends religious service:  Not on file    Active member of club or organization: Not on file    Attends meetings of clubs or organizations: Not on file    Relationship status: Not on file  Other Topics Concern  . Not on file  Social History Narrative  . Not on file   Family History: No family history on file. Allergies: No Known Allergies Medications: See med rec.  Review of Systems: No fevers, chills, night sweats, weight loss, chest pain, or shortness of breath.   Objective:    General: Well Developed, well nourished, and in no acute distress.  Neuro: Alert and oriented x3, extra-ocular muscles intact, sensation grossly intact.  HEENT: Normocephalic, atraumatic, pupils equal round reactive to light, neck supple, no masses, no lymphadenopathy, thyroid nonpalpable.  Skin: Warm and dry, no rashes. Cardiac: Regular rate and rhythm, no murmurs rubs or gallops, no lower extremity edema.  Respiratory: Clear to auscultation bilaterally. Not using accessory muscles, speaking in full sentences. Left knee: Visible scar from MPFL reconstruction, there is significant lateral deviation of the patella, I can nearly dislocate it myself, she does have a mild apprehension sign. ROM normal in flexion and extension and lower leg rotation. Ligaments with solid consistent endpoints including ACL, PCL, LCL, MCL. Negative Mcmurray's and provocative meniscal tests. Mildly painful patellar compression. Patellar and quadriceps tendons unremarkable. Hamstring and  quadriceps strength is normal.  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 Thurston Hole in the past. Has recently had another few dislocations. She has a very mobile and easily dislocated patella on exam today. At this point I do think she probably needs a repeat MPFL reconstruction and possibly a Fulkerson slide or something of the sort. X-rays,  MRI, continue meloxicam. Continue brace. Referral to Dr. Everardo Pacific to discuss this.  Left first proximal phalangeal fracture with gamekeeper's thumb. Completely resolved with conservative treatment, return as needed for this.   ___________________________________________ Ihor Austin. Benjamin Stain, M.D., ABFM., CAQSM. Primary Care and Sports Medicine [place] [facility]  Adjunct Professor of Family Medicine  [employer]

## 2018-12-01 NOTE — Assessment & Plan Note (Signed)
Completely resolved with conservative treatment, return as needed for this.

## 2019-05-04 ENCOUNTER — Ambulatory Visit (INDEPENDENT_AMBULATORY_CARE_PROVIDER_SITE_OTHER): Payer: PRIVATE HEALTH INSURANCE | Admitting: Orthopedic Surgery

## 2019-05-04 ENCOUNTER — Encounter: Payer: Self-pay | Admitting: Orthopedic Surgery

## 2019-05-04 ENCOUNTER — Other Ambulatory Visit: Payer: Self-pay

## 2019-05-04 DIAGNOSIS — M25562 Pain in left knee: Secondary | ICD-10-CM

## 2019-05-04 NOTE — Progress Notes (Signed)
Office Visit Note   Patient: Briana Murray           Date of Birth: 02-Jan-1996           MRN: [id number] Visit Date: 05/04/2019 Requested by: No referring provider defined for this encounter. PCP: Patient, No Pcp Per  Subjective: Chief Complaint  Patient presents with  . Right Knee - Pain  . Left Knee - Pain    HPI: Briana Murray is a patient with bilateral knee pain and patellar instability.  Left is worse than right.  She had instability beginning around age 47 or 63.  Had surgery in January 2009 done here which sounds like it was some type of medial reefing and imbrication.  Did well for 2 years and then she developed recurrent instability.  She has been seen by other physicians.  Her mother is a Surveyor, mining in [place].  She has a history of right knee patella dislocation once or twice but the left knee dislocates about once a month and it has become very painful and swollen at times.  She is unable to do much activity.  She does do barrel racing as a horse rider.  She just finished masters of public health and lives both in [place] and in [place].              ROS: All systems reviewed are negative as they relate to the chief complaint within the history of present illness.  Patient denies  fevers or chills.   Assessment & Plan: Visit Diagnoses:  1. Left knee pain, unspecified chronicity     Plan: Impression is patellar instability left knee worse than right with no real anatomic abnormalities at this time in terms of increased Q angle or shallow trochlear groove.  I think the patient does have some patellofemoral wear and tear is seen by osteophytes present around the lateral facet of the patella particularly on that left knee.  Plan is MRI scan of that left knee.  Essentially the decision will be whether or not to do a soft tissue procedure for stabilization versus some type of tibial tubercle osteotomy.  We will see what the MRI scan shows.  No real history of connective tissue  disease and she does not have excessive laxity in her other joints.  Follow-Up Instructions: Return for after MRI.   Orders:  Orders Placed This Encounter  Procedures  . MR Knee Left w/o contrast   No orders of the defined types were placed in this encounter.     Procedures: No procedures performed   Clinical Data: No additional findings.  Objective: Vital Signs: There were no vitals taken for this visit.  Physical Exam:   Constitutional: Patient appears well-developed HEENT:  Head: Normocephalic Eyes:EOM are normal Neck: Normal range of motion Cardiovascular: Normal rate Pulmonary/chest: Effort normal Neurologic: Patient is alert Skin: Skin is warm Psychiatric: Patient has normal mood and affect    Ortho Exam: Ortho exam demonstrates normal gait alignment.  No increased Q angle or increased valgus alignment bilateral lower extremities.  Mild patellofemoral crepitus is present bilaterally.  The patellas do sit slightly more lateral in relation to the femur more on the left than the right.  Extensor mechanism is intact.  Pedal pulses palpable.  Patient has good range of motion.  A little patellar apprehension on the left compared to the right.  Little bit more lateral instability on the left compared to the right.  She moves about 2-1/2 cm on the  left and about 2 cm on the right.  Specialty Comments:  No specialty comments available.  Imaging: No results found.   PMFS History: Patient Active Problem List   Diagnosis Date Noted  . Closed patellar dislocation, left, sequela 12/01/2018  . Left first proximal phalangeal fracture with gamekeeper's thumb. 09/20/2018   History reviewed. No pertinent past medical history.  History reviewed. No pertinent family history.  Past Surgical History:  Procedure Laterality Date  . KNEE SURGERY     Social History   Occupational History  . Not on file  Tobacco Use  . Smoking status: Never Smoker  . Smokeless tobacco:  Never Used  Substance and Sexual Activity  . Alcohol use: No  . Drug use: Not on file  . Sexual activity: Yes    Birth control/protection: None

## 2019-06-07 ENCOUNTER — Other Ambulatory Visit: Payer: Self-pay | Admitting: Orthopedic Surgery

## 2019-06-08 ENCOUNTER — Ambulatory Visit
Admission: RE | Admit: 2019-06-08 | Discharge: 2019-06-08 | Disposition: A | Discharge status: Home / Self Care | Payer: PRIVATE HEALTH INSURANCE | Source: Ambulatory Visit | Attending: Orthopedic Surgery | Admitting: Orthopedic Surgery

## 2019-06-08 ENCOUNTER — Other Ambulatory Visit: Payer: Self-pay

## 2019-06-08 DIAGNOSIS — M25562 Pain in left knee: Secondary | ICD-10-CM

## 2019-06-14 ENCOUNTER — Telehealth: Payer: Self-pay

## 2019-06-14 NOTE — Telephone Encounter (Signed)
Patient has had her MRI scan. She would like to know if you can call her to discuss results and what plan is for her knee.  Please advise.  [phone] (M)

## 2019-06-17 NOTE — Telephone Encounter (Signed)
I called and we talked about the scan.  Essentially the decision is between MPFL alone versus MPFL plus tibial tubercle osteotomy.  She does have some arthritis which really is good to be a management problem moving forward and not anything we can reverse.  She is good to make an appointment to come in sometime next week.

## 2019-06-20 NOTE — Telephone Encounter (Signed)
Can you please call her and get her worked in for one day this week with Dr Marlou Sa? Thanks.

## 2019-06-22 ENCOUNTER — Other Ambulatory Visit: Payer: Self-pay

## 2019-06-22 ENCOUNTER — Encounter: Payer: Self-pay | Admitting: Orthopedic Surgery

## 2019-06-22 ENCOUNTER — Ambulatory Visit (INDEPENDENT_AMBULATORY_CARE_PROVIDER_SITE_OTHER): Payer: PRIVATE HEALTH INSURANCE | Admitting: Orthopedic Surgery

## 2019-06-22 DIAGNOSIS — M25362 Other instability, left knee: Secondary | ICD-10-CM | POA: Diagnosis not present

## 2019-06-22 DIAGNOSIS — M25562 Pain in left knee: Secondary | ICD-10-CM | POA: Diagnosis not present

## 2019-06-22 DIAGNOSIS — M1712 Unilateral primary osteoarthritis, left knee: Secondary | ICD-10-CM | POA: Diagnosis not present

## 2019-06-25 ENCOUNTER — Encounter: Payer: Self-pay | Admitting: Orthopedic Surgery

## 2019-06-25 NOTE — Progress Notes (Signed)
Office Visit Note   Patient: Briana Murray           Date of Birth: March 15, 1996           MRN: [id number] Visit Date: 06/22/2019 Requested by: No referring provider defined for this encounter. PCP: Patient, No Pcp Per  Subjective: No chief complaint on file.   HPI: Briana Murray is a patient with left knee pain.  Since I have seen her she has had an MRI scan.  I also discussed with her those findings on the phone and they are here to reviewed in person.  Right knee has less of a history of patellofemoral instability but the left knee has a more frequent history of instability and to a lesser degree pain.  Patient has had prior medial imbrication during her teenage years which only lasted about 2 to 3 years.              ROS: All systems reviewed are negative as they relate to the chief complaint within the history of present illness.  Patient denies  fevers or chills.   Assessment & Plan: Visit Diagnoses:  1. Patellofemoral arthritis of left knee   2. 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  Reviewed that with the patient and her mother who is a Surveyor, mining using axial images from the MRI scan.  The patient also has a fair amount of patellofemoral arthritis and cartilage wear on the patella.  Patient does have relatively easy instability of the patella which is painful and debilitating for her.  Regarding operative options she has lateral tracking patella and reasonable groove in the trochlea.  Operative options would be medial patellofemoral ligament reconstruction which I think would help with some component of the instability.  That does have the risks of over constraining the patellofemoral joint.  The MPFL is attenuated but intact on axial MRI scanning.  Another option would be intramedialization of the tibial tubercle to improve the  mechanics of the patella tracking.  That could also unload that lateral trochlea which has areas of full-thickness chondral loss along with the patellar apex and medial patellar facet.  That is a much bigger operation with higher risk.  On physical examination the patella has a distinct lateralizing vector of pull when very is knee is going from extension to flexion.  The risk and benefits of both these procedures are discussed.  I think if she had intramedialization of the tibial tubercle MPFL imbrication or reconstruction would also be indicated.  Patient is going to consider the risk and benefits of surgery along with the expected rehabilitation time and she will let me know if she wants to proceed.  Follow-Up Instructions: Return if symptoms worsen or fail to improve.   Orders:  No orders of the defined types were placed in this encounter.  No orders of the defined types were placed in this encounter.     Procedures: No procedures performed   Clinical Data: No additional findings.  Objective: Vital Signs: There were no vitals taken for this visit.  Physical Exam:   Constitutional: Patient appears well-developed HEENT:  Head: Normocephalic Eyes:EOM are normal Neck: Normal range of motion Cardiovascular: Normal rate Pulmonary/chest: Effort normal Neurologic: Patient is alert Skin: Skin is warm Psychiatric: Patient has normal mood and affect    Ortho Exam: Ortho exam demonstrates  normal gait alignment with no increased Q angle.  Not much in the way of valgus alignment of bilateral lower extremities.  The left knee patella tracks laterally with positive J sign.  I can correct and medialized the patella with some force.  No effusion in the knee joint today.  Patellofemoral crepitus is present.  Worse on the left compared to the right.  The patella has definite propensity to lateral subluxation with manual manipulation.  Specialty Comments:  No specialty comments  available.  Imaging: No results found.   PMFS History: Patient Active Problem List   Diagnosis Date Noted  . Closed patellar dislocation, left, sequela 12/01/2018  . Left first proximal phalangeal fracture with gamekeeper's thumb. 09/20/2018   History reviewed. No pertinent past medical history.  History reviewed. No pertinent family history.  Past Surgical History:  Procedure Laterality Date  . KNEE SURGERY     Social History   Occupational History  . Not on file  Tobacco Use  . Smoking status: Never Smoker  . Smokeless tobacco: Never Used  Substance and Sexual Activity  . Alcohol use: No  . Drug use: Not on file  . Sexual activity: Yes    Birth control/protection: None

## 2019-11-08 IMAGING — DX LEFT KNEE - COMPLETE 4+ VIEW
4 series · 4 of 4 positions shown · non-contrast
Comparison: None.

CLINICAL DATA: Closed patellar dislocation

EXAM:
LEFT KNEE - COMPLETE 4+ VIEW

[knee sunrise]
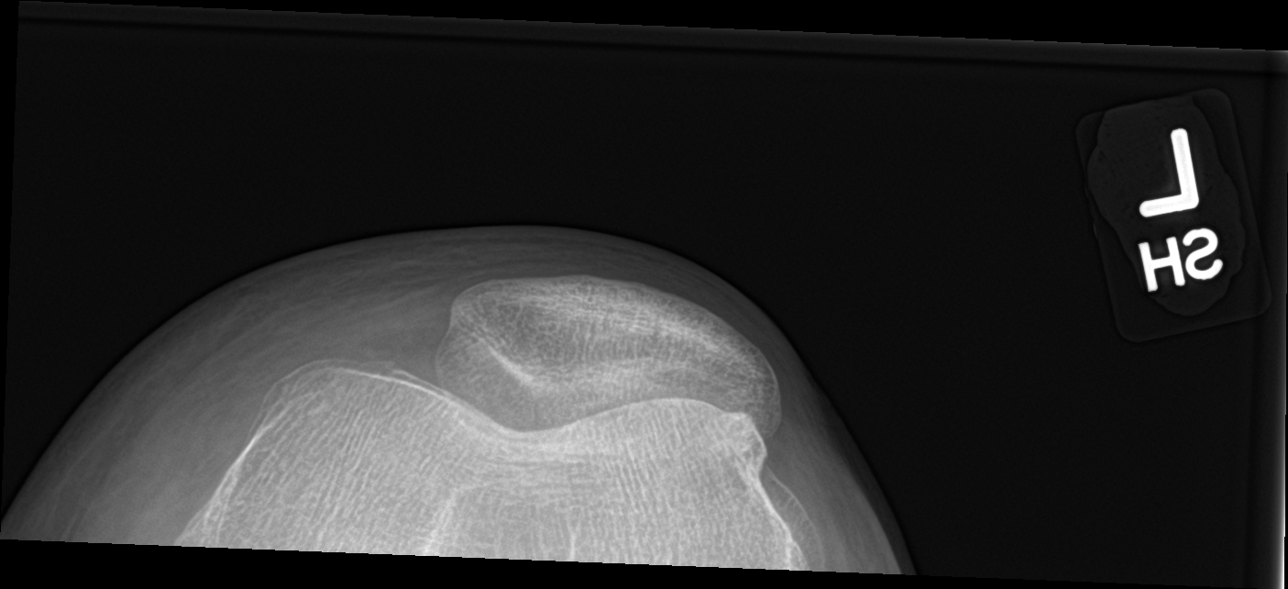

[knee ap bilat standing (1 of 2)]
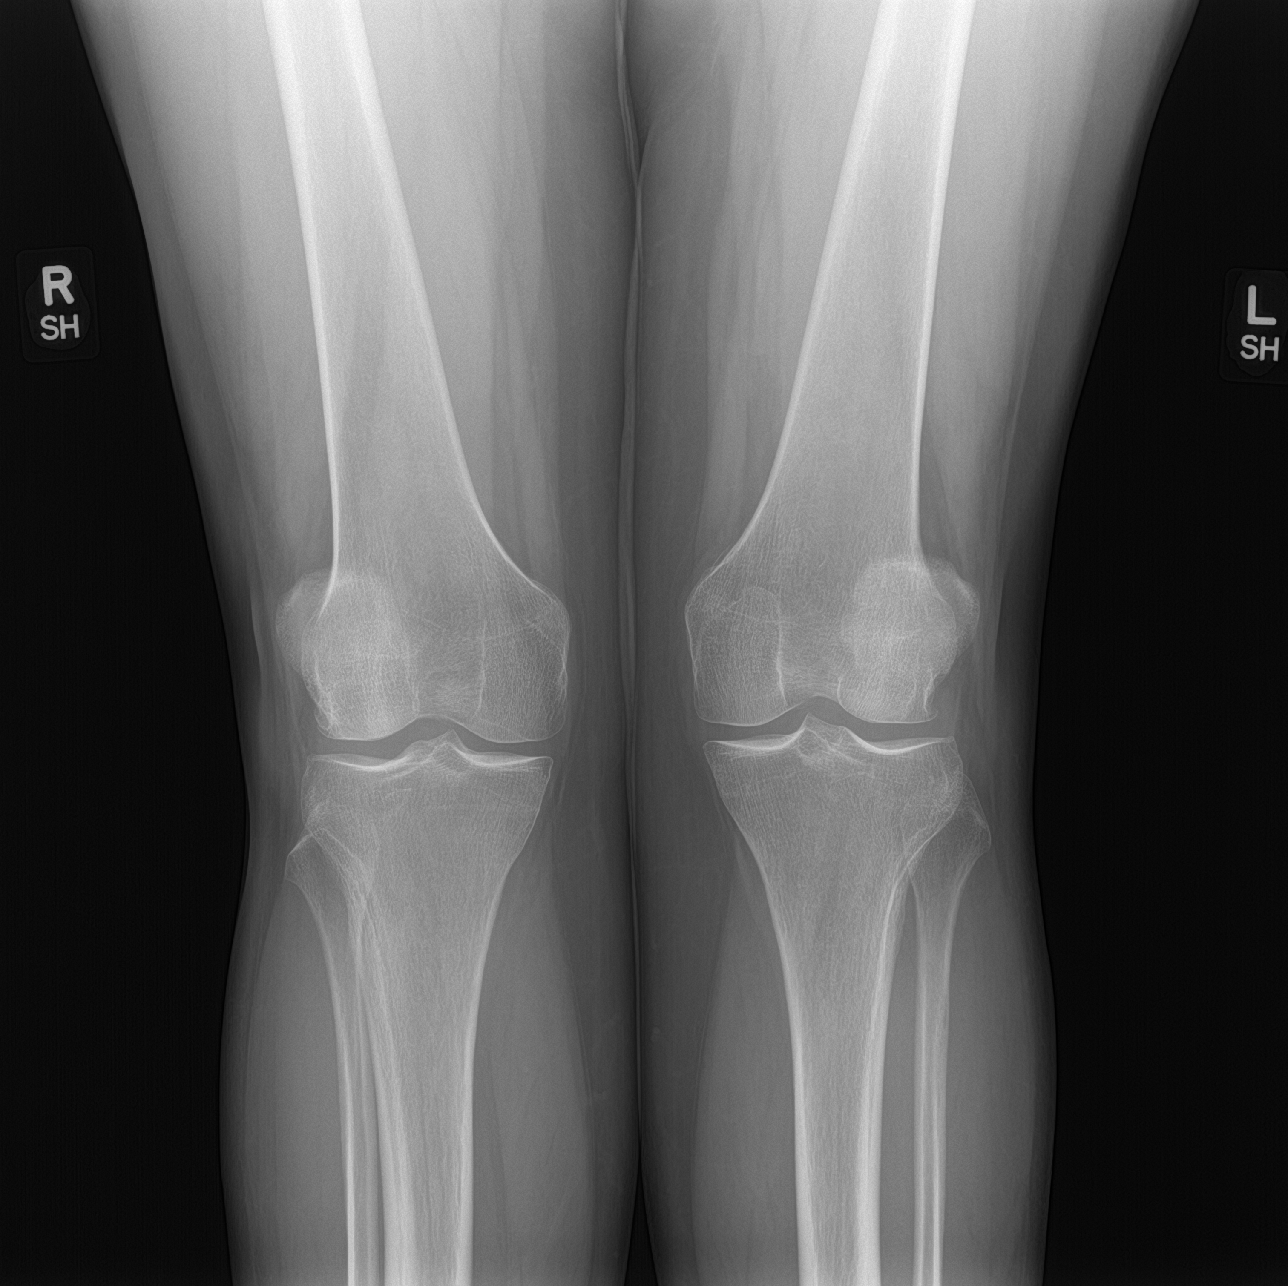

[knee ap bilat standing (2 of 2)]
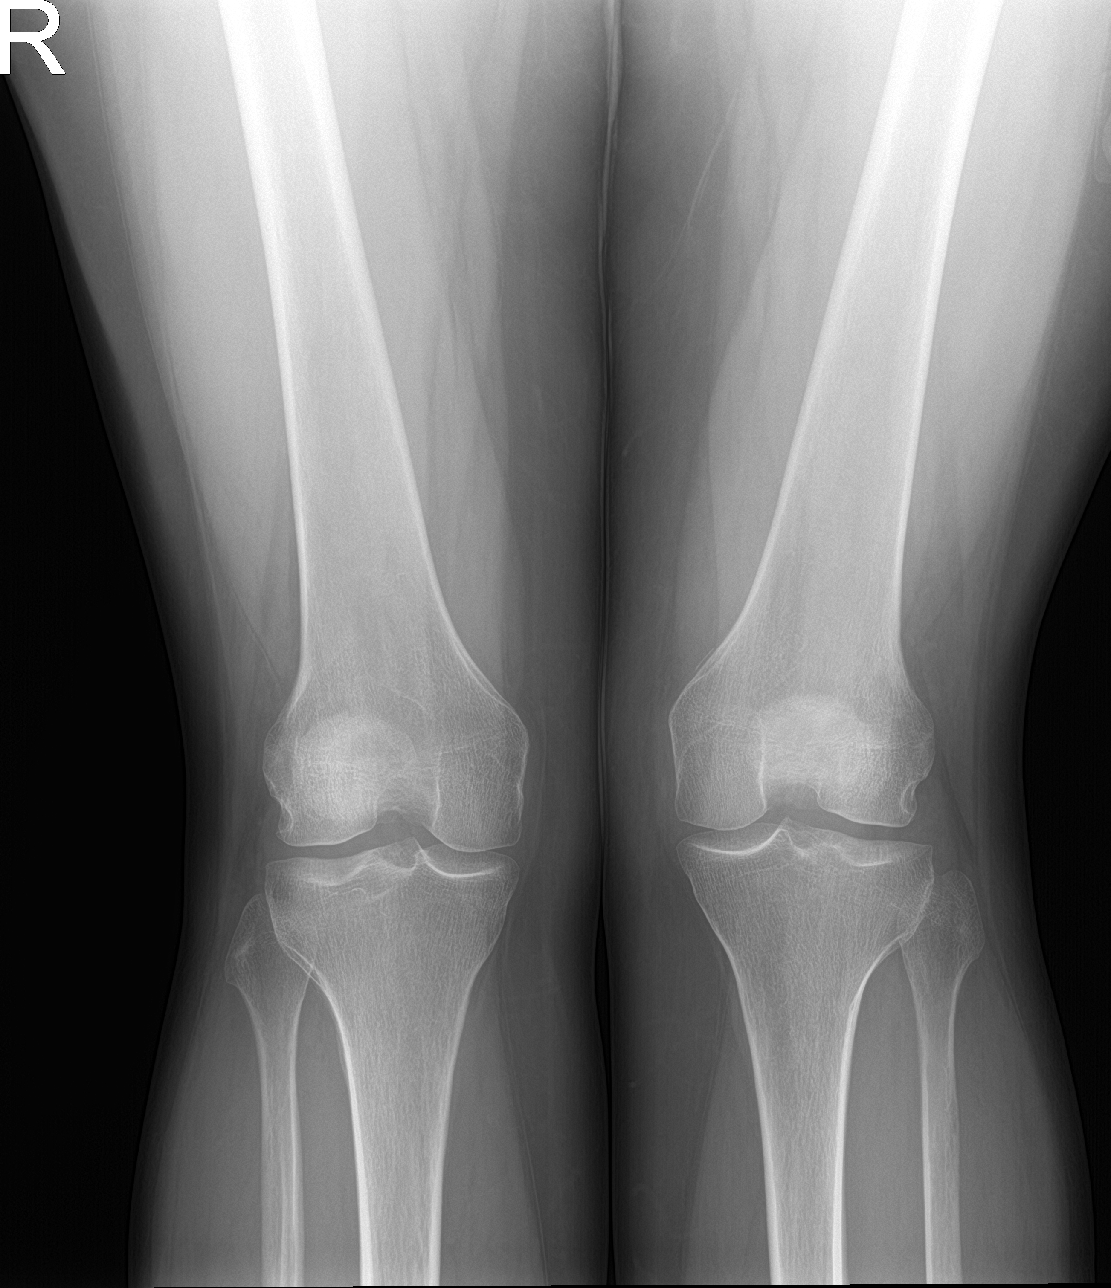

[knee lat]
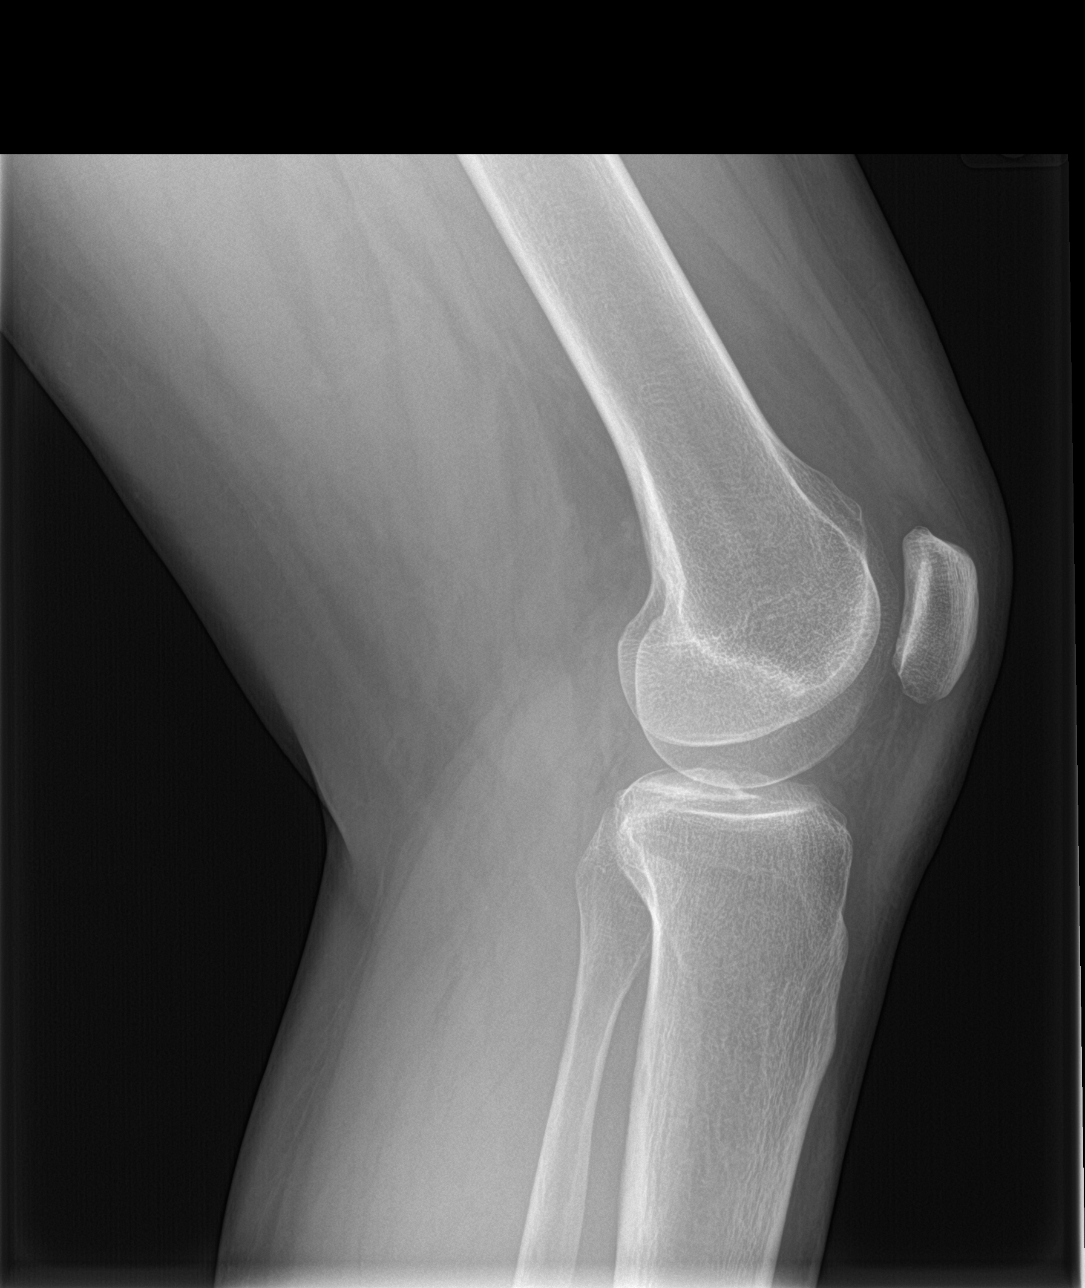

[4 of 4 positions shown; findings below may reference images not displayed]

FINDINGS: No evidence of fracture, dislocation, or joint effusion. No evidence
of arthropathy or other focal bone abnormality. Soft tissues are
unremarkable. Patella normally located.
IMPRESSION: Negative.
